# Patient Record
Sex: Female | Born: 1975 | Hispanic: Yes | Marital: Married | Smoking: Former smoker
Health system: Southern US, Community
[De-identification: ages and names within clinical notes are randomized; demographics above are authoritative.]

---

## 2017-09-12 ENCOUNTER — Other Ambulatory Visit: Payer: Self-pay

## 2017-09-12 ENCOUNTER — Emergency Department
Admission: EM | Admit: 2017-09-12 | Discharge: 2017-09-12 | Disposition: A | Discharge status: Home / Self Care | Payer: Commercial Managed Care - PPO | Attending: Emergency Medicine | Admitting: Emergency Medicine

## 2017-09-12 ENCOUNTER — Encounter: Payer: Self-pay | Admitting: Emergency Medicine

## 2017-09-12 DIAGNOSIS — J111 Influenza due to unidentified influenza virus with other respiratory manifestations: Secondary | ICD-10-CM

## 2017-09-12 DIAGNOSIS — R52 Pain, unspecified: Secondary | ICD-10-CM | POA: Diagnosis present

## 2017-09-12 MED ORDER — BENZONATATE 100 MG PO CAPS: 100.0000 mg | ORAL_CAPSULE | Freq: Three times a day (TID) | ORAL | 0 refills | Status: AC | PRN

## 2017-09-12 MED ORDER — ALBUTEROL SULFATE HFA 108 (90 BASE) MCG/ACT IN AERS: 2.0000 | INHALATION_SPRAY | Freq: Four times a day (QID) | RESPIRATORY_TRACT | 0 refills | Status: AC | PRN

## 2017-09-12 MED ORDER — OSELTAMIVIR PHOSPHATE 75 MG PO CAPS: 75.0000 mg | ORAL_CAPSULE | Freq: Two times a day (BID) | ORAL | 0 refills | Status: AC

## 2017-09-12 NOTE — ED Triage Notes (Signed)
Presents with body aches subjective fever,cold,cough and headache   Sxs; started on friday

## 2017-09-12 NOTE — ED Triage Notes (Signed)
FIRST NURSE NOTE-thinks may have flu. Ambulatory.  Unlabored.

## 2017-09-12 NOTE — ED Provider Notes (Signed)
[facility] Emergency Department Provider Note  ____________________________________________  Time seen: Approximately 12:08 PM  I have reviewed the triage vital signs and the nursing notes.   HISTORY  Chief Complaint Generalized Body Aches    HPI Morgan Rice is a 42 y.o. female that presents to emergency department for evaluation of headache, fever, chills, body aches, nasal congestion, nonproductive cough for 2 days.  She has several coworkers with the flu.  She does not smoke.  No nausea, vomiting, diarrhea, constipation.  History reviewed. No pertinent past medical history.  There are no active problems to display for this patient.   History reviewed. No pertinent surgical history.  Prior to Admission medications   Medication Sig Start Date End Date Taking? Authorizing Provider  albuterol (PROVENTIL HFA;VENTOLIN HFA) 108 (90 Base) MCG/ACT inhaler Inhale 2 puffs into the lungs every 6 (six) hours as needed for wheezing or shortness of breath. 09/12/17   Enid DerryWagner, Asta Corbridge, PA-C  benzonatate (TESSALON PERLES) 100 MG capsule Take 1 capsule (100 mg total) by mouth 3 (three) times daily as needed for cough. 09/12/17 09/12/18  Enid DerryWagner, Tea Collums, PA-C  oseltamivir (TAMIFLU) 75 MG capsule Take 1 capsule (75 mg total) by mouth 2 (two) times daily for 5 days. 09/12/17 09/17/17  Enid DerryWagner, Priyal Musquiz, PA-C    Allergies Patient has no known allergies.  No family history on file.  Social History Social History   Tobacco Use  . Smoking status: Never Smoker  . Smokeless tobacco: Never Used  Substance Use Topics  . Alcohol use: No    Frequency: Never  . Drug use: Not on file     Review of Systems  Constitutional: No fever/chills Eyes: No visual changes. No discharge. ENT: Positive for congestion and rhinorrhea. Cardiovascular: No chest pain. Respiratory: Positive for cough.  Gastrointestinal: No abdominal pain.  No nausea, no vomiting.  No diarrhea.  No  constipation. Musculoskeletal: Negative for musculoskeletal pain. Skin: Negative for rash, abrasions, lacerations, ecchymosis.   ____________________________________________   PHYSICAL EXAM:  VITAL SIGNS: ED Triage Vitals  Enc Vitals Group     BP 09/12/17 1116 122/79     Pulse Rate 09/12/17 1116 89     Resp 09/12/17 1116 16     Temp 09/12/17 1116 99.6 F (37.6 C)     Temp Source 09/12/17 1116 Oral     SpO2 09/12/17 1116 98 %     Weight 09/12/17 1113 190 lb (86.2 kg)     Height --      Head Circumference --      Peak Flow --      Pain Score 09/12/17 1113 4     Pain Loc --      Pain Edu? --      Excl. in GC? --      Constitutional: Alert and oriented. Well appearing and in no acute distress. Eyes: Conjunctivae are normal. PERRL. EOMI. No discharge. Head: Atraumatic. ENT: No frontal and maxillary sinus tenderness.      Ears: Tympanic membranes pearly gray with good landmarks. No discharge.      Nose: Mild congestion/rhinnorhea.      Mouth/Throat: Mucous membranes are moist. Oropharynx non-erythematous. Tonsils not enlarged. No exudates. Uvula midline. Neck: No stridor.   Hematological/Lymphatic/Immunilogical: No cervical lymphadenopathy. Cardiovascular: Normal rate, regular rhythm.  Good peripheral circulation. Respiratory: Normal respiratory effort without tachypnea or retractions. Lungs CTAB. Good air entry to the bases with no decreased or absent breath sounds. Gastrointestinal: Bowel sounds 4 quadrants. Soft and nontender to  palpation. No guarding or rigidity. No palpable masses. No distention. Musculoskeletal: Full range of motion to all extremities. No gross deformities appreciated. Neurologic:  Normal speech and language. No gross focal neurologic deficits are appreciated.  Skin:  Skin is warm, dry and intact. No rash noted.   ____________________________________________   LABS (all labs ordered are listed, but only abnormal results are displayed)  Labs  Reviewed - No data to display ____________________________________________  EKG   ____________________________________________  RADIOLOGY Influenza No results found.  ____________________________________________    PROCEDURES  Procedure(s) performed:    Procedures    Medications - No data to display   ____________________________________________   INITIAL IMPRESSION / ASSESSMENT AND PLAN / ED COURSE  Pertinent labs & imaging results that were available during my care of the patient were reviewed by me and considered in my medical decision making (see chart for details).  Review of the  [place]RS was performed in accordance of the [facility] prior to dispensing any controlled drugs.     Patient's diagnosis is consistent with influenza. Vital signs and exam are reassuring. Patient appears well and is staying well hydrated. Patient should alternate tylenol and ibuprofen for fever. Patient feels comfortable going home. Patient will be discharged home with prescriptions for Tamiflu, albuterol inhaler, Tessalon Perles. Patient is to follow up with PCP as needed or otherwise directed. Patient is given ED precautions to return to the ED for any worsening or new symptoms.     ____________________________________________  FINAL CLINICAL IMPRESSION(S) / ED DIAGNOSES  Final diagnoses:  Influenza      NEW MEDICATIONS STARTED DURING THIS VISIT:  ED Discharge Orders        Ordered    oseltamivir (TAMIFLU) 75 MG capsule  2 times daily     09/12/17 1215    albuterol (PROVENTIL HFA;VENTOLIN HFA) 108 (90 Base) MCG/ACT inhaler  Every 6 hours PRN     09/12/17 1215    benzonatate (TESSALON PERLES) 100 MG capsule  3 times daily PRN     09/12/17 1215          This chart was dictated using voice recognition software/Dragon. Despite best efforts to proofread, errors can occur which can change the meaning. Any change was purely unintentional.    Enid Derry, PA-C 09/12/17  1228    Minna Antis, MD 09/12/17 1425

## 2018-05-25 ENCOUNTER — Other Ambulatory Visit: Payer: Self-pay | Admitting: Certified Nurse Midwife

## 2018-05-25 DIAGNOSIS — Z1231 Encounter for screening mammogram for malignant neoplasm of breast: Secondary | ICD-10-CM

## 2018-06-17 ENCOUNTER — Ambulatory Visit
Admission: RE | Admit: 2018-06-17 | Discharge: 2018-06-17 | Disposition: A | Discharge status: Home / Self Care | Payer: Commercial Managed Care - PPO | Source: Ambulatory Visit | Attending: Certified Nurse Midwife | Admitting: Certified Nurse Midwife

## 2018-06-17 DIAGNOSIS — Z1231 Encounter for screening mammogram for malignant neoplasm of breast: Secondary | ICD-10-CM | POA: Diagnosis not present

## 2018-11-10 ENCOUNTER — Other Ambulatory Visit: Payer: Self-pay

## 2018-11-11 ENCOUNTER — Encounter (INDEPENDENT_AMBULATORY_CARE_PROVIDER_SITE_OTHER): Payer: Self-pay

## 2018-11-11 ENCOUNTER — Encounter: Payer: Self-pay | Admitting: Internal Medicine

## 2018-11-11 ENCOUNTER — Other Ambulatory Visit: Payer: Self-pay

## 2018-11-11 ENCOUNTER — Inpatient Hospital Stay: Payer: Commercial Managed Care - PPO | Attending: Internal Medicine | Admitting: Internal Medicine

## 2018-11-11 DIAGNOSIS — N920 Excessive and frequent menstruation with regular cycle: Secondary | ICD-10-CM

## 2018-11-11 DIAGNOSIS — D689 Coagulation defect, unspecified: Secondary | ICD-10-CM | POA: Diagnosis not present

## 2018-11-11 NOTE — Progress Notes (Signed)
[facility] CONSULT NOTE  Patient Care Team: Allegra Grana, FNP as PCP - General (Family Medicine)  CHIEF COMPLAINTS/PURPOSE OF CONSULTATION: coagulopathy  #March 2020-elevated factor VIII levels [incidental part of von Willebrand work-up]; PT/PPT Normal; WBD work up-Normal.   # MENORRHAGIA  #   No history exists.     HISTORY OF PRESENTING ILLNESS:  Morgan Rice 43 y.o.  female longstanding history of menorrhagia has been referred to [place] for further evaluation recommendations for possible coagulopathy.  No history of DVT PE.  Denies any significant bleeding episodes apart from menorrhagia.  Awaiting GYN evaluation/restart on birth control pills.  Easy bruising: none Prior Surgeries: appendectomy [1996] Tooth extraction: No bleeding.  Family history: None Blood thinners/NSAIDs; none.   Review of Systems  Constitutional: Negative for chills, diaphoresis, fever, malaise/fatigue and weight loss.  HENT: Negative for nosebleeds and sore throat.   Eyes: Negative for double vision.  Respiratory: Negative for cough, hemoptysis, sputum production, shortness of breath and wheezing.   Cardiovascular: Negative for chest pain, palpitations, orthopnea and leg swelling.  Gastrointestinal: Negative for abdominal pain, blood in stool, constipation, diarrhea, heartburn, melena, nausea and vomiting.  Genitourinary: Negative for dysuria, frequency and urgency.  Musculoskeletal: Negative for back pain and joint pain.  Skin: Negative.  Negative for itching and rash.  Neurological: Negative for dizziness, tingling, focal weakness, weakness and headaches.  Endo/Heme/Allergies: Does not bruise/bleed easily.  Psychiatric/Behavioral: Negative for depression. The patient is not nervous/anxious and does not have insomnia.      MEDICAL HISTORY:  History reviewed. No pertinent past medical history.  SURGICAL HISTORY: History reviewed. No pertinent surgical history.  SOCIAL  HISTORY: Social History   Socioeconomic History  . Marital status: Married    Spouse name: Not on file  . Number of children: Not on file  . Years of education: Not on file  . Highest education level: Not on file  Occupational History  . Not on file  Social Needs  . Financial resource strain: Not on file  . Food insecurity:    Worry: Not on file    Inability: Not on file  . Transportation needs:    Medical: Not on file    Non-medical: Not on file  Tobacco Use  . Smoking status: Former Smoker    Last attempt to quit: 08/10/2004    Years since quitting: 14.2  . Smokeless tobacco: Never Used  Substance and Sexual Activity  . Alcohol use: No    Frequency: Never  . Drug use: Never  . Sexual activity: Not on file  Lifestyle  . Physical activity:    Days per week: Not on file    Minutes per session: Not on file  . Stress: Not on file  Relationships  . Social connections:    Talks on phone: Not on file    Gets together: Not on file    Attends religious service: Not on file    Active member of club or organization: Not on file    Attends meetings of clubs or organizations: Not on file    Relationship status: Not on file  . Intimate partner violence:    Fear of current or ex partner: Not on file    Emotionally abused: Not on file    Physically abused: Not on file    Forced sexual activity: Not on file  Other Topics Concern  . Not on file  Social History Narrative   Patient works in [employer].  No smoking no alcohol.  Lives in [place].        No family history of bleeding disorders or blood clots.    FAMILY HISTORY: History reviewed. No pertinent family history.  ALLERGIES:  has No Known Allergies.  MEDICATIONS:  Current Outpatient Medications  Medication Sig Dispense Refill  . albuterol (PROVENTIL HFA;VENTOLIN HFA) 108 (90 Base) MCG/ACT inhaler Inhale 2 puffs into the lungs every 6 (six) hours as needed for wheezing or shortness of breath. 1 Inhaler 0  .  norgestimate-ethinyl estradiol (ORTHO-CYCLEN,SPRINTEC,PREVIFEM) 0.25-35 MG-MCG tablet Take by mouth.     No current facility-administered medications for this visit.       Marland Kitchen  PHYSICAL EXAMINATION: ECOG PERFORMANCE STATUS: 0 - Asymptomatic  Vitals:   11/11/18 1112  BP: (!) 143/85  Pulse: 76  Resp: 16  Temp: (!) 96.7 F (35.9 C)   Filed Weights   11/11/18 1112  Weight: 191 lb 9.6 oz (86.9 kg)    Physical Exam  Constitutional: She is oriented to person, place, and time and well-developed, well-nourished, and in no distress.  HENT:  Head: Normocephalic and atraumatic.  Mouth/Throat: Oropharynx is clear and moist. No oropharyngeal exudate.  Eyes: Pupils are equal, round, and reactive to light.  Neck: Normal range of motion. Neck supple.  Cardiovascular: Normal rate and regular rhythm.  Pulmonary/Chest: No respiratory distress. She has no wheezes.  Abdominal: Soft. Bowel sounds are normal. She exhibits no distension and no mass. There is no abdominal tenderness. There is no rebound and no guarding.  Musculoskeletal: Normal range of motion.        General: No tenderness or edema.  Neurological: She is alert and oriented to person, place, and time.  Skin: Skin is warm.  Psychiatric: Affect normal.     LABORATORY DATA:  I have reviewed the data as listed No results found for: WBC, HGB, HCT, MCV, PLT No results for input(s): NA, K, CL, CO2, GLUCOSE, BUN, CREATININE, CALCIUM, GFRNONAA, GFRAA, PROT, ALBUMIN, AST, ALT, ALKPHOS, BILITOT, BILIDIR, IBILI in the last 8760 hours.  RADIOGRAPHIC STUDIES: I have personally reviewed the radiological images as listed and agreed with the findings in the report. No results found.  ASSESSMENT & PLAN:   Coagulopathy (HCC) #Elevated factor VIII levels-incidentally noted as part of work-up for von Willebrand disease.  This is likely reactive/clinically inconsequential.  Would not recommend any further work-up.  Elevated factor VIII levels if  at all clinically significant causes blood clots [DVT/PE]; and not bleeding issues.  #Menorrhagia-unlikely due to coagulopathy.  Patient awaiting GYN evaluation/plan to start birth control pills.  # Cramping in legs-question secondary to relative iron deficiency.  Recommend p.o. iron.  Recent CBC within normal limits.  Do not suspect any DVTs.   # Educated the patient regarding novel coronavirus-modes of transmission/risks; and measures to avoid infection.    Thank you Ms.Arnett for allowing me to participate in the care of your pleasant patient. Please do not hesitate to contact me with questions or concerns in the interim.  # 45 minutes face-to-face with the patient discussing the above plan of care; more than 50% of time spent on prognosis/ natural history; counseling and coordination.  # DISPOSITION:  Follow up as needed-Dr.B    All questions were answered. The patient knows to call the clinic with any problems, questions or concerns.    Earna Coder, MD 11/11/2018 11:57 AM

## 2018-11-11 NOTE — Assessment & Plan Note (Addendum)
#  Elevated factor VIII levels-incidentally noted as part of work-up for von Willebrand disease.  This is likely reactive/clinically inconsequential.  Would not recommend any further work-up.  Elevated factor VIII levels if at all clinically significant causes blood clots [DVT/PE]; and not bleeding issues.  #Menorrhagia-unlikely due to coagulopathy.  Patient awaiting GYN evaluation/plan to start birth control pills.  # Cramping in legs-question secondary to relative iron deficiency.  Recommend p.o. iron.  Recent CBC within normal limits.  Do not suspect any DVTs.   # Educated the patient regarding novel coronavirus-modes of transmission/risks; and measures to avoid infection.    Thank you Ms.Arnett for allowing me to participate in the care of your pleasant patient. Please do not hesitate to contact me with questions or concerns in the interim.  # 45 minutes face-to-face with the patient discussing the above plan of care; more than 50% of time spent on prognosis/ natural history; counseling and coordination.  # DISPOSITION:  Follow up as needed-Dr.B

## 2019-11-04 ENCOUNTER — Ambulatory Visit: Payer: Commercial Managed Care - PPO | Attending: Internal Medicine

## 2019-11-04 DIAGNOSIS — Z23 Encounter for immunization: Secondary | ICD-10-CM

## 2019-11-04 NOTE — Progress Notes (Signed)
   Covid-19 Vaccination Clinic  Name:  Chezney Huether    MRN: [id number] DOB: 1976-03-19  11/04/2019  Ms. Perman was observed post Covid-19 immunization for 15 minutes without incident. She was provided with Vaccine Information Sheet and instruction to access the V-Safe system.   Ms. Luff was instructed to call 911 with any severe reactions post vaccine: Marland Kitchen Difficulty breathing  . Swelling of face and throat  . A fast heartbeat  . A bad rash all over body  . Dizziness and weakness   Immunizations Administered    Name Date Dose VIS Date Route   Pfizer COVID-19 Vaccine 11/04/2019  4:08 PM 0.3 mL 07/21/2019 Intramuscular   Manufacturer: [employer], [employer]   Lot: [id number]   NDC: [address]-5

## 2019-11-25 ENCOUNTER — Ambulatory Visit: Payer: Commercial Managed Care - PPO | Attending: Internal Medicine

## 2019-11-25 DIAGNOSIS — Z23 Encounter for immunization: Secondary | ICD-10-CM

## 2019-11-25 NOTE — Progress Notes (Signed)
   Covid-19 Vaccination Clinic  Name:  Morgan Rice    MRN: [id number] DOB: 14-Jan-1976  11/25/2019  Morgan Rice was observed post Covid-19 immunization for 15 minutes without incident. She was provided with Vaccine Information Sheet and instruction to access the V-Safe system.   Morgan Rice was instructed to call 911 with any severe reactions post vaccine: Marland Kitchen Difficulty breathing  . Swelling of face and throat  . A fast heartbeat  . A bad rash all over body  . Dizziness and weakness   Immunizations Administered    Name Date Dose VIS Date Route   Pfizer COVID-19 Vaccine 11/25/2019  3:41 PM 0.3 mL 07/21/2019 Intramuscular   Manufacturer: [employer], [employer]   Lot: [id number]   NDC: [address]-3

## 2020-02-09 ENCOUNTER — Other Ambulatory Visit: Payer: Self-pay | Admitting: Internal Medicine

## 2020-02-09 DIAGNOSIS — Z1231 Encounter for screening mammogram for malignant neoplasm of breast: Secondary | ICD-10-CM

## 2020-07-13 IMAGING — MG DIGITAL SCREENING BILATERAL MAMMOGRAM WITH TOMO AND CAD
8 series · 8 of 24 positions shown · non-contrast
Comparison: None.

CLINICAL DATA: Screening. Baseline.

EXAM:
DIGITAL SCREENING BILATERAL MAMMOGRAM WITH TOMO AND CAD

[R CC synth-2D]
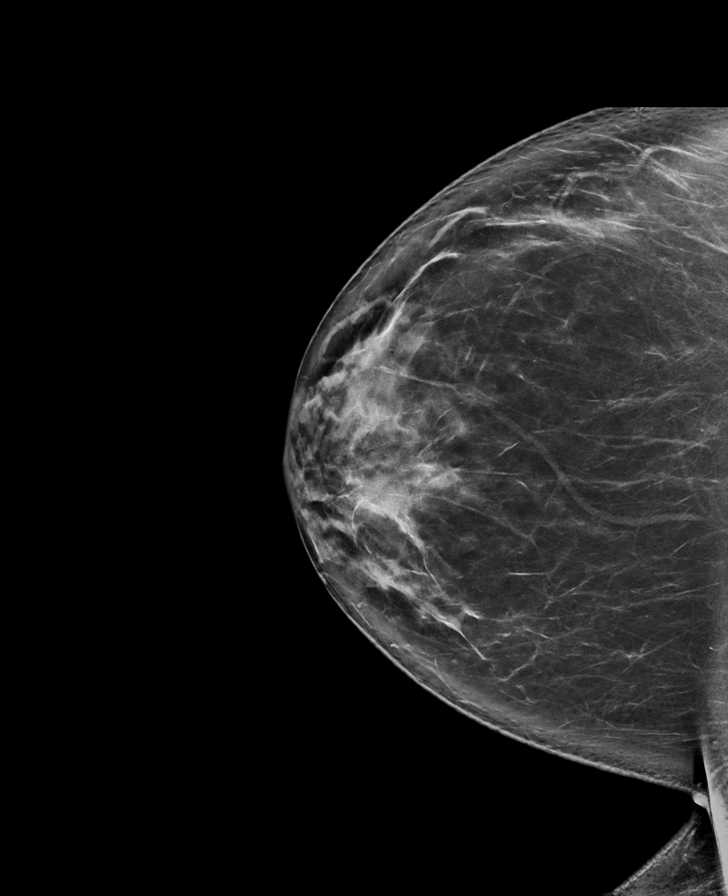

[R MLO synth-2D]
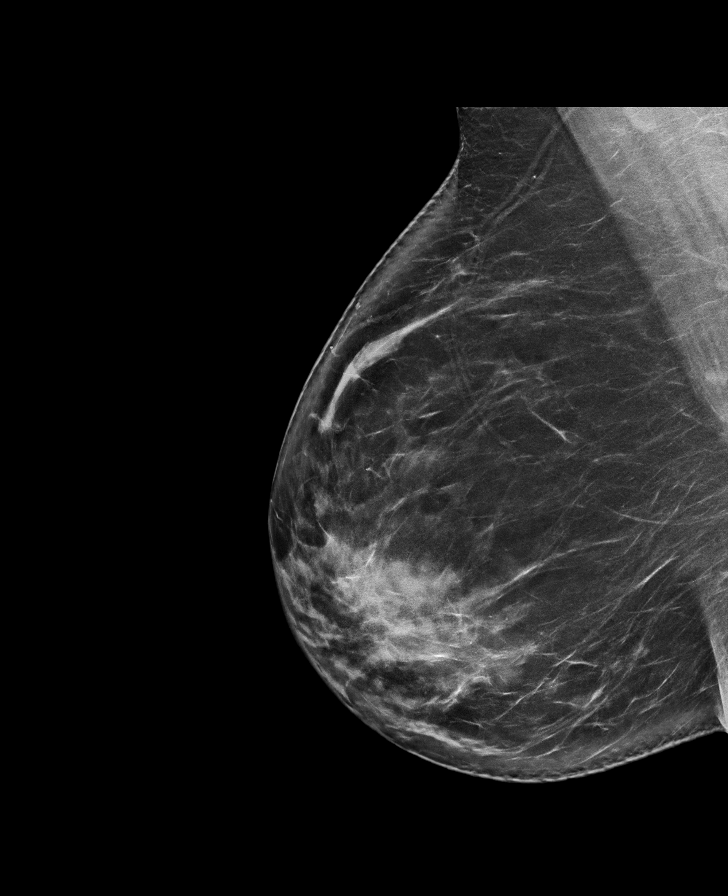

[L MLO synth-2D]
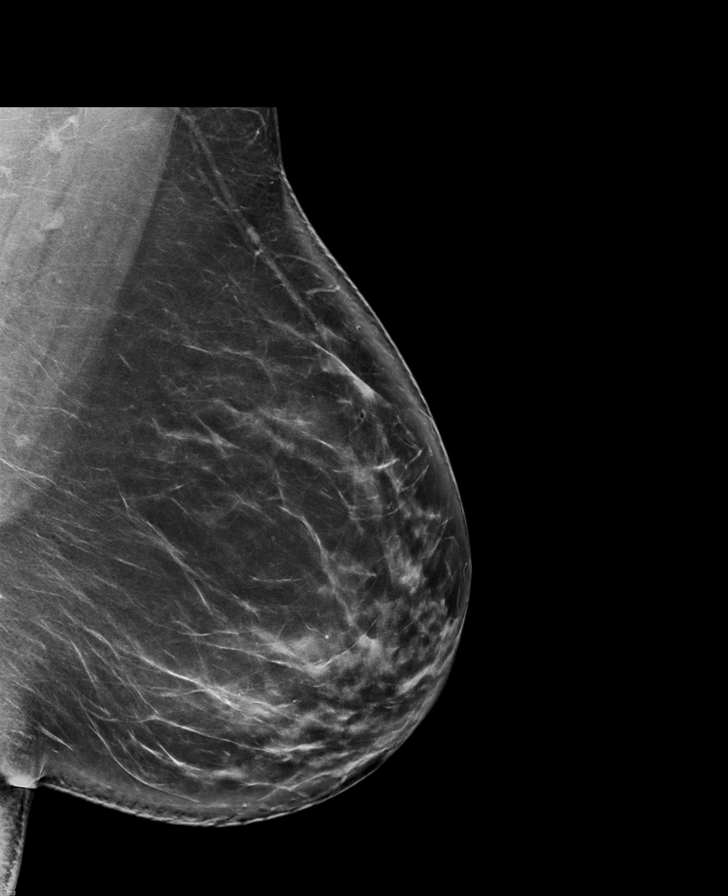

[L CC synth-2D]
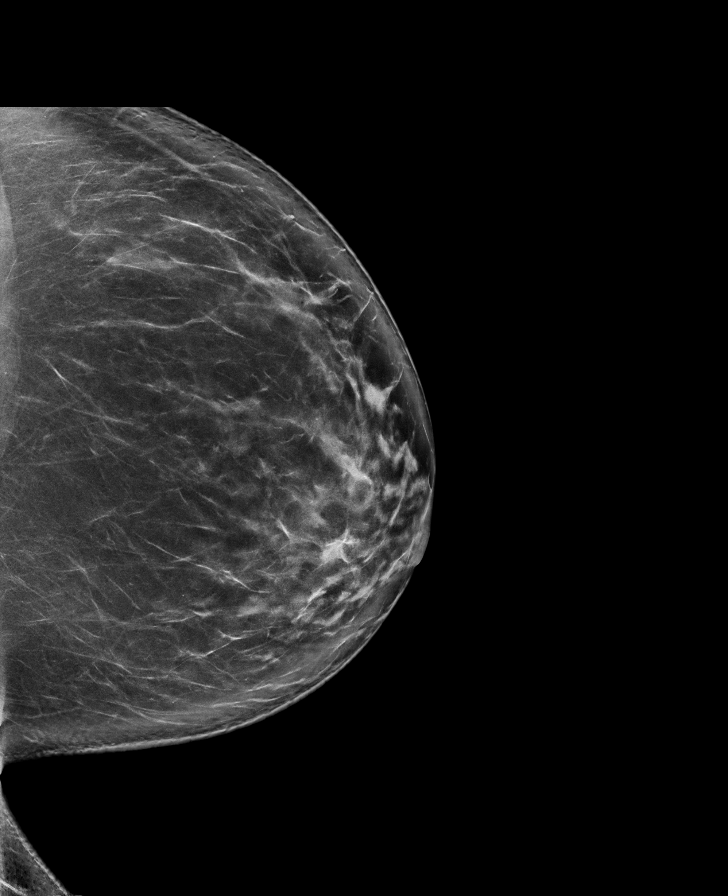

[R CC tomo · tomo slice 47/94.0]
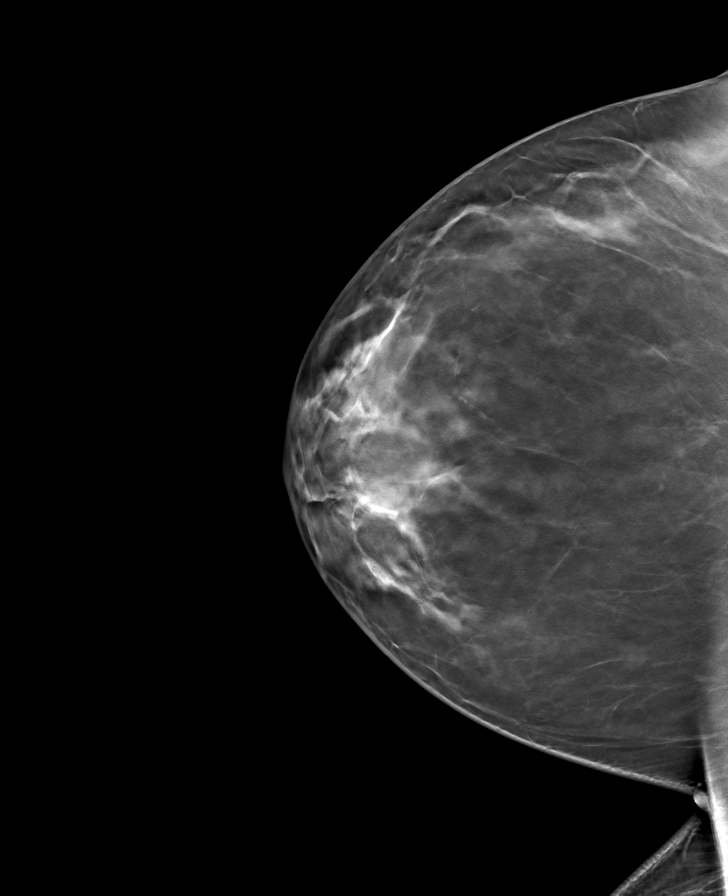

[R MLO tomo · tomo slice 50/99.0]
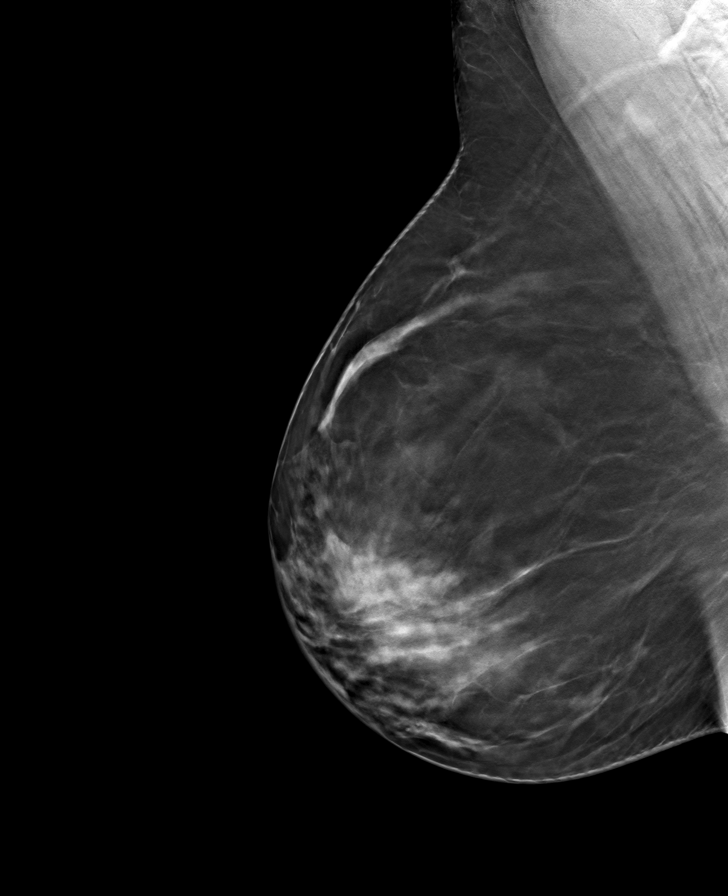

[L CC tomo · tomo slice 49/97.0]
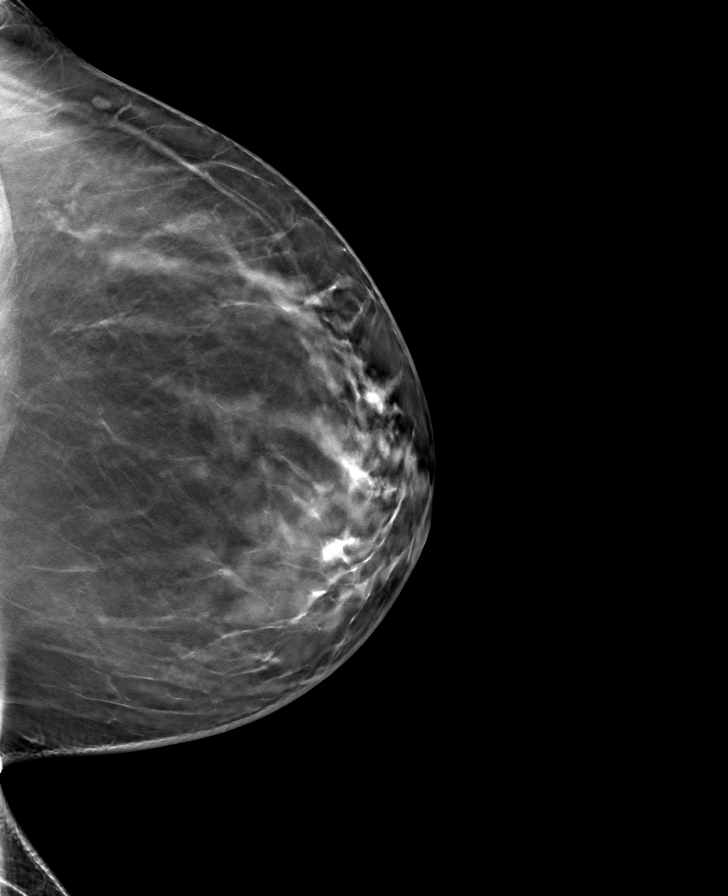

[L MLO tomo · tomo slice 51/101.0]
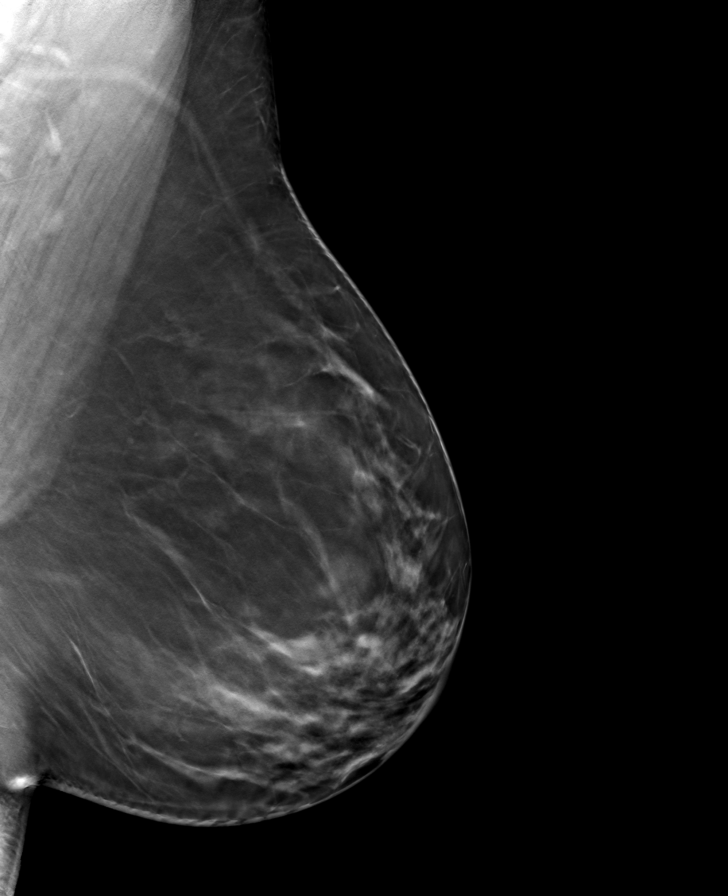

[8 of 24 positions shown; findings below may reference images not displayed]

ACR Breast Density Category c: The breast tissue is heterogeneously
dense, which may obscure small masses
FINDINGS: There are no findings suspicious for malignancy. Images were
processed with CAD.
IMPRESSION: No mammographic evidence of malignancy. A result letter of this
screening mammogram will be mailed directly to the patient.

RECOMMENDATION:
Screening mammogram in one year. (Code:EN-2-3B0)

BI-RADS CATEGORY  1: Negative.
